# Patient Record
Sex: Female | Born: 1965 | Race: White | Smoking: Never smoker
Health system: Southern US, Community
[De-identification: ages and names within clinical notes are randomized; demographics above are authoritative.]

## PROBLEM LIST (undated history)

## (undated) DIAGNOSIS — G4700 Insomnia, unspecified: Secondary | ICD-10-CM

## (undated) DIAGNOSIS — G2581 Restless legs syndrome: Secondary | ICD-10-CM

## (undated) DIAGNOSIS — G43909 Migraine, unspecified, not intractable, without status migrainosus: Secondary | ICD-10-CM

## (undated) HISTORY — PX: OTHER SURGICAL HISTORY: SHX169

## (undated) HISTORY — PX: CHOLECYSTECTOMY: SHX55

## (undated) HISTORY — DX: Migraine, unspecified, not intractable, without status migrainosus: G43.909

## (undated) HISTORY — PX: ABDOMINAL HYSTERECTOMY: SHX81

## (undated) HISTORY — DX: Insomnia, unspecified: G47.00

## (undated) HISTORY — DX: Restless legs syndrome: G25.81

---

## 2018-09-02 DIAGNOSIS — E282 Polycystic ovarian syndrome: Secondary | ICD-10-CM | POA: Insufficient documentation

## 2018-09-02 DIAGNOSIS — F5104 Psychophysiologic insomnia: Secondary | ICD-10-CM | POA: Insufficient documentation

## 2018-09-02 DIAGNOSIS — D509 Iron deficiency anemia, unspecified: Secondary | ICD-10-CM | POA: Insufficient documentation

## 2018-09-02 DIAGNOSIS — G43109 Migraine with aura, not intractable, without status migrainosus: Secondary | ICD-10-CM | POA: Insufficient documentation

## 2018-09-02 DIAGNOSIS — R251 Tremor, unspecified: Secondary | ICD-10-CM | POA: Insufficient documentation

## 2018-09-02 DIAGNOSIS — G2581 Restless legs syndrome: Secondary | ICD-10-CM | POA: Insufficient documentation

## 2019-07-24 DIAGNOSIS — M5440 Lumbago with sciatica, unspecified side: Secondary | ICD-10-CM | POA: Insufficient documentation

## 2019-07-24 DIAGNOSIS — Z6831 Body mass index (BMI) 31.0-31.9, adult: Secondary | ICD-10-CM | POA: Insufficient documentation

## 2020-02-16 ENCOUNTER — Other Ambulatory Visit (HOSPITAL_COMMUNITY): Payer: Self-pay | Admitting: Orthopedic Surgery

## 2020-02-16 ENCOUNTER — Other Ambulatory Visit: Payer: Self-pay | Admitting: Orthopedic Surgery

## 2020-02-16 DIAGNOSIS — M5442 Lumbago with sciatica, left side: Secondary | ICD-10-CM

## 2020-02-16 DIAGNOSIS — G8929 Other chronic pain: Secondary | ICD-10-CM

## 2020-02-27 ENCOUNTER — Ambulatory Visit: Payer: PRIVATE HEALTH INSURANCE

## 2020-03-11 ENCOUNTER — Ambulatory Visit
Admission: RE | Admit: 2020-03-11 | Discharge: 2020-03-11 | Disposition: A | Discharge status: Home / Self Care | Payer: PRIVATE HEALTH INSURANCE | Source: Ambulatory Visit | Attending: Orthopedic Surgery | Admitting: Orthopedic Surgery

## 2020-03-11 ENCOUNTER — Other Ambulatory Visit: Payer: Self-pay

## 2020-03-11 DIAGNOSIS — G8929 Other chronic pain: Secondary | ICD-10-CM | POA: Diagnosis present

## 2020-03-11 DIAGNOSIS — M5442 Lumbago with sciatica, left side: Secondary | ICD-10-CM | POA: Diagnosis present

## 2020-03-11 DIAGNOSIS — M5441 Lumbago with sciatica, right side: Secondary | ICD-10-CM | POA: Insufficient documentation

## 2020-04-30 ENCOUNTER — Encounter: Payer: Self-pay | Admitting: Pain Medicine

## 2020-04-30 ENCOUNTER — Ambulatory Visit
Admission: RE | Admit: 2020-04-30 | Discharge: 2020-04-30 | Disposition: A | Discharge status: Home / Self Care | Payer: No Typology Code available for payment source | Source: Ambulatory Visit | Attending: Pain Medicine | Admitting: Pain Medicine

## 2020-04-30 ENCOUNTER — Ambulatory Visit: Payer: No Typology Code available for payment source | Admitting: Pain Medicine

## 2020-04-30 ENCOUNTER — Ambulatory Visit
Admission: RE | Admit: 2020-04-30 | Discharge: 2020-04-30 | Disposition: A | Discharge status: Home / Self Care | Payer: No Typology Code available for payment source | Attending: Pain Medicine | Admitting: Pain Medicine

## 2020-04-30 ENCOUNTER — Other Ambulatory Visit: Payer: Self-pay

## 2020-04-30 VITALS — BP 130/83 | HR 93 | Temp 97.9°F | Resp 16 | Ht 66.0 in | Wt 190.0 lb

## 2020-04-30 DIAGNOSIS — Z79891 Long term (current) use of opiate analgesic: Secondary | ICD-10-CM | POA: Insufficient documentation

## 2020-04-30 DIAGNOSIS — G8929 Other chronic pain: Secondary | ICD-10-CM

## 2020-04-30 DIAGNOSIS — M545 Low back pain, unspecified: Secondary | ICD-10-CM

## 2020-04-30 DIAGNOSIS — M899 Disorder of bone, unspecified: Secondary | ICD-10-CM | POA: Insufficient documentation

## 2020-04-30 DIAGNOSIS — M25552 Pain in left hip: Secondary | ICD-10-CM | POA: Insufficient documentation

## 2020-04-30 DIAGNOSIS — M47816 Spondylosis without myelopathy or radiculopathy, lumbar region: Secondary | ICD-10-CM | POA: Insufficient documentation

## 2020-04-30 DIAGNOSIS — M5137 Other intervertebral disc degeneration, lumbosacral region: Secondary | ICD-10-CM | POA: Insufficient documentation

## 2020-04-30 DIAGNOSIS — M47817 Spondylosis without myelopathy or radiculopathy, lumbosacral region: Secondary | ICD-10-CM | POA: Insufficient documentation

## 2020-04-30 DIAGNOSIS — G894 Chronic pain syndrome: Secondary | ICD-10-CM | POA: Insufficient documentation

## 2020-04-30 DIAGNOSIS — Z79899 Other long term (current) drug therapy: Secondary | ICD-10-CM | POA: Insufficient documentation

## 2020-04-30 DIAGNOSIS — M48061 Spinal stenosis, lumbar region without neurogenic claudication: Secondary | ICD-10-CM | POA: Insufficient documentation

## 2020-04-30 DIAGNOSIS — F1120 Opioid dependence, uncomplicated: Secondary | ICD-10-CM | POA: Insufficient documentation

## 2020-04-30 DIAGNOSIS — Z789 Other specified health status: Secondary | ICD-10-CM | POA: Insufficient documentation

## 2020-04-30 DIAGNOSIS — M5441 Lumbago with sciatica, right side: Secondary | ICD-10-CM | POA: Insufficient documentation

## 2020-04-30 DIAGNOSIS — R937 Abnormal findings on diagnostic imaging of other parts of musculoskeletal system: Secondary | ICD-10-CM | POA: Insufficient documentation

## 2020-04-30 NOTE — Progress Notes (Signed)
Patient: Sara Willis  Service Category: E/M  Provider: Gaspar Cola, MD  DOB: 08-06-1965  DOS: 04/30/2020  Referring Provider: Doyle Askew, MD  MRN: [id number]  Setting: Ambulatory outpatient  PCP: Gustavo Lah, MD  Type: New Patient  Specialty: Interventional Pain Management    Location: Office  Delivery: Face-to-face     Primary Reason(s) for Visit: Encounter for initial evaluation of one or more chronic problems (new to examiner) potentially causing chronic pain, and posing a threat to normal musculoskeletal function. (Level of risk: High) CC: Back Pain (Lower, both sides, worse on right side)  HPI  Sara Willis is a 55 y.o. year old, female patient, who comes for the first time to our practice referred by Girtha Hake I, MD for our initial evaluation of her chronic pain. 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 Today she comes in for evaluation of her Back Pain (Lower, both sides, worse on right side)  Pain Assessment: Location: Right,Left,Lower Back Radiating: right buttock Onset: More than a month ago Duration: Chronic pain Quality: Shooting Severity: 4 /10  (subjective, self-reported pain score)  Effect on ADL: difficulty performing daily activities Timing: Constant Modifying factors: Flexeril, NSAIDS BP: 130/83  HR: 93  Onset and Duration: Gradual and Date of onset: 11/21 Cause of pain: Unknown Severity: No change since onset, NAS-11 at its worse: 8/10, NAS-11 at its best: 2/10, NAS-11 now: 4/10 and NAS-11 on the average: 7/10 Timing: Night, During activity or exercise and After activity or exercise Aggravating Factors: Bending, Kneeling, Lifiting, Motion, Twisting and Walking Alleviating Factors: Medications and Resting Associated Problems: Pain that wakes patient up Quality of Pain: Agonizing, Constant, Distressing, Exhausting, Sharp, Shooting and Stabbing Previous Examinations or Tests: MRI scan and X-rays Previous Treatments: Physical Therapy  The patient comes into the clinic today for an evaluation for a possible bilateral L4 transforaminal ESI under fluoroscopic guidance and IV sedation, recommended by Dr. Girtha Hake from the [facility] department.  According to the patient the primary area of pain is that of the lower back (Bilateral) (R>L).  She denies any back surgeries but does admit having had nerve blocks in the form of an apparent facet block done around December, 2021 by Dr. Oleta Mouse ([facility]).  The patient indicated that the procedure was done with no sedation, she experienced no paresthesias or adverse reactions, it was done with steroids, and it did provide her with short-term (1 to 2 days) of 80% relief from her back pain.  At the time she was not experiencing any type of lower extremity pain.  However, the patient classified this procedure as unsuccessful since she was expecting to get long-term benefit.  Notes from Dr. Girtha Hake also made mention of this "unsuccessful" procedure.  The patient did have physical therapy for this low back pain around August 2021 and 4.  2 to 3 months.  She describes that the physical  therapy not only did not help the pain but it actually made it worse.  The patient's secondary area pain is that of the mid back (Midline).  She indicates that currently she is not having this pain but around 2000 (8th area of Pain) she had a cervical disc herniation and they referred pain towards the mid lower back around the midline.  At that time she was residing in [place] where she had a nerve block done which did help this pain and the pain went away for approximately 2 to 3 years.  She refers that this nerve block was done by Dr. Deatra Ina in [place] ([facility]) telephone number: [phone].  The patient's third area of involvement is that of the hands (Bilateral) (Right = Left).  She indicated that the primary problem was numbness.  She denies any current pain or weakness.  She also indicated that they ruled out carpal tunnel syndrome (CTS).  She describes that in both hands it affects the pinky finger and the ring finger (C8 dermatome).  Physical exam: The patient was able to toe walk and heel walk without any problems.  She denies any lower extremity pain, numbness, or weakness.  Hyperextension and rotation as well as Kemp maneuver were positive bilaterally for facet arthralgia that was ipsilateral to the side being tested.  The patient also confirmed that it exactly reproduced her usual low back pain.  Lumbar spine range of motion seems to be limited on hyperextension on rotation, probably due to guarding.  Flexion was asymptomatic and within normal limits in terms of her range of motion.  Patrick maneuver was positive on the left side for hip arthralgia.  Range of motion of the hip joints seem to be relatively within normal limits.  Sacroiliac joint testing was negative during the Patrick maneuver and distraction maneuver.  The rest of exam was within normal limits.  Pharmacotherapy: The patient is currently taking 5 mg of methadone at bedtime, but she indicates that this  is not being prescribed to her for pain but for restless leg syndrome.  Note from Dr. Girtha Hake on 03/21/2020 indicates that the patient had lumbar facet injections done at [facility], which did not provide her with any relief of the pain.  She indicates in the note that she will be sending the patient over to [facility] to have the procedure done with sedation due to the patient's anxiety.  She indicates patient has been sent for bilateral L4 transforaminal ESI in an attempt to target the L3-4 CCS (central canal stenosis).  Dr. Alba Destine indicates that she will follow-up with the patient in 2 weeks after the injections.  Imaging review from Dr. Alba Destine note: "MRI lumbar spine 03/2020 done at [facility]: L3-4 central disc protrusion with mild CCS (central canal stenosis) and bilateral NFS (neuroforaminal stenosis); mild facet arthropathy L3-4 and moderate facet arthropathy L4-5"  Based on today's evaluation, it is my impression that although the patient is MRI reveals central spinal stenosis as well as foraminal stenosis, neither one of those is currently symptomatic.  However, the MRI also describes facet joint arthropathy, which at this point seems to be clinically responsible for the patient's pain.  I do realize that the patient had a facet block apparently done by Etheleen Mayhew, MD [facility]) done on 01/03/2020, according to my review of "Care Everywhere".  According to the procedure note, this procedure was done under fluoroscopic guidance using 1 mL of  1% preservative-free lidocaine with 60 mg of Kenalog.  The procedure performed was a right-sided, intra-articular L4-5 and L5-S1 facet joint injection with 0.5 mL of contrast to confirm location, followed by 30 mg of Kenalog with 1 mL of 1% preservative-free lidocaine, for each facet joint.  Because the patient's MRI was done on 03/11/2020, they did not have the benefit of knowing that the facet degenerative changes  were located primarily at the L3-4 and L4-5 levels.  Statistically 98% of the cases involving lumbar facet syndrome, will involve the L3-4, L4-5, and L5-S1 facet joints.  Based on the information above, I have informed the patient that I would recommend doing a diagnostic bilateral lumbar facet MBB of the L3-4, L4-5, and L5-S1 facet joints, under fluoroscopic guidance and IV sedation.  I have explained the logic to the patient who understood and accepted.  Today I took the time to provide the patient with information regarding my pain practice. The patient was informed that my practice is divided into two sections: an interventional pain management section, as well as a completely separate and distinct medication management section. I explained that I have procedure days for my interventional therapies, and evaluation days for follow-ups and medication management. Because of the amount of documentation required during both, they are kept separated. This means that there is the possibility that she may be scheduled for a procedure on one day, and medication management the next. I have also informed her that because of staffing and facility limitations, I no longer take patients for medication management only. To illustrate the reasons for this, I gave the patient the example of surgeons, and how inappropriate it would be to refer a patient to his/her care, just to write for the post-surgical antibiotics on a surgery done by a different surgeon.   Because interventional pain management is my board-certified specialty, the patient was informed that joining my practice means that they are open to any and all interventional therapies. I made it clear that this does not mean that they will be forced to have any procedures done. What this means is that I believe interventional therapies to be essential part of the diagnosis and proper management of chronic pain conditions. Therefore, patients not interested in these  interventional alternatives will be better served under the care of a different practitioner.  The patient was also made aware of my Comprehensive Pain Management Safety Guidelines where by joining my practice, they limit all of their nerve blocks and joint injections to those done by our practice, for as long as we are retained to manage their care.   Historic Controlled Substance Pharmacotherapy Review  PMP and historical list of controlled substances: Methadone 5 mg tablet, 1.5 tabs p.o. daily (7.5 mg/day of methadone) (last prescribed on 04/04/2020 by Dr. Cherylann Ratel, and filled on 04/04/2020) (22.5 MME/day) Current opioid analgesics: Methadone 5 mg tablet, 1.5 tabs p.o. daily (7.5 mg/day of methadone)  MME/day: 22.5 mg/day  Historical Monitoring: The patient  reports no history of drug use. List of all UDS Test(s): No results found for: MDMA, COCAINSCRNUR, [place], [place], CANNABQUANT, THCU, [place] List of other Serum/Urine Drug Screening Test(s):  No results found for: AMPHSCRSER, BARBSCRSER, BENZOSCRSER, COCAINSCRSER, COCAINSCRNUR, PCPSCRSER, PCPQUANT, THCSCRSER, THCU, CANNABQUANT, OPIATESCRSER, OXYSCRSER, PROPOXSCRSER, ETH Historical Background Evaluation: [place] PMP: PDMP reviewed during this encounter. Online review of the past 1-monthperiod conducted.             PMP NARX Score Report:  Narcotic: 391 Sedative: 250 Stimulant:  000 [place] Department of public safety, offender search: Editor, commissioning Information) Non-contributory Risk Assessment Profile: Aberrant behavior: None observed or detected today Risk factors for fatal opioid overdose: None identified today PMP NARX Overdose Risk Score: 160 Fatal overdose hazard ratio (HR): Calculation deferred Non-fatal overdose hazard ratio (HR): Calculation deferred Risk of opioid abuse or dependence: 0.7-3.0% with doses ? 36 MME/day and 6.1-26% with doses ? 120 MME/day. Substance use disorder (SUD) risk level: See below Personal History of  Substance Abuse (SUD-Substance use disorder):  Alcohol: Negative  Illegal Drugs: Negative  Rx Drugs: Negative  ORT Risk Level calculation: Low Risk  Opioid Risk Tool - 04/30/20 1328      Family History of Substance Abuse   Alcohol Negative    Illegal Drugs Negative    Rx Drugs Negative      Personal History of Substance Abuse   Alcohol Negative    Illegal Drugs Negative    Rx Drugs Negative      Age   Age between 54-45 years  No      History of Preadolescent Sexual Abuse   History of Preadolescent Sexual Abuse Negative or Female      Psychological Disease   Psychological Disease Negative    Depression Negative      Total Score   Opioid Risk Tool Scoring 0    Opioid Risk Interpretation Low Risk          ORT Scoring interpretation table:  Score <3 = Low Risk for SUD  Score between 4-7 = Moderate Risk for SUD  Score >8 = High Risk for Opioid Abuse   PHQ-2 Depression Scale:  Total score: 0  PHQ-2 Scoring interpretation table: (Score and probability of major depressive disorder)  Score 0 = No depression  Score 1 = 15.4% Probability  Score 2 = 21.1% Probability  Score 3 = 38.4% Probability  Score 4 = 45.5% Probability  Score 5 = 56.4% Probability  Score 6 = 78.6% Probability   PHQ-9 Depression Scale:  Total score: 0  PHQ-9 Scoring interpretation table:  Score 0-4 = No depression  Score 5-9 = Mild depression  Score 10-14 = Moderate depression  Score 15-19 = Moderately severe depression  Score 20-27 = Severe depression (2.4 times higher risk of SUD and 2.89 times higher risk of overuse)   Pharmacologic Plan: As per protocol, I have not taken over any controlled substance management, pending the results of ordered tests and/or consults.            Initial impression: The patient indicated having no interest, at this time.  Meds   Current Outpatient Medications:  .  ascorbic acid (VITAMIN C) 500 MG tablet, Take by mouth., Disp: , Rfl:  .  cyclobenzaprine  (FLEXERIL) 10 MG tablet, Take 1 tablet by mouth 3 (three) times daily., Disp: , Rfl:  .  Docusate Sodium (COLACE PO), Take 200 mg by mouth daily., Disp: , Rfl:  .  ferrous sulfate 325 (65 FE) MG tablet, Take by mouth., Disp: , Rfl:  .  Magnesium 250 MG TABS, Take by mouth., Disp: , Rfl:  .  methadone (DOLOPHINE) 5 MG tablet, 7 mg, Disp: , Rfl:  .  rizatriptan (MAXALT) 10 MG tablet, , Disp: , Rfl:  .  traZODone (DESYREL) 100 MG tablet, Take by mouth., Disp: , Rfl:  .  VITAMIN D, CHOLECALCIFEROL, PO, Take 5,000 Units by mouth., Disp: , Rfl:   Imaging Review  Lumbosacral Imaging: Lumbar MR wo contrast: Results for orders placed  during the hospital encounter of 03/11/20 MR LUMBAR SPINE WO CONTRAST  Narrative CLINICAL DATA:  Chronic low back pain  EXAM: MRI LUMBAR SPINE WITHOUT CONTRAST  TECHNIQUE: Multiplanar, multisequence MR imaging of the lumbar spine was performed. No intravenous contrast was administered.  COMPARISON:  None.  FINDINGS: Segmentation:  Standard.  Alignment:  Physiologic.  Vertebrae:  No fracture, evidence of discitis, or bone lesion.  Conus medullaris and cauda equina: Conus extends to the L1 level. Conus and cauda equina appear normal.  Paraspinal and other soft tissues: Negative.  Disc levels:  T12-L1: No spinal canal or neural foraminal stenosis.  L1-2: No spinal canal or neural foraminal stenosis.  L2-3: No spinal canal or neural foraminal stenosis.  L3-4: Mild loss of disc height, central disc protrusion and mild facet degenerative changes resulting in mild spinal canal stenosis and mild bilateral neural foraminal narrowing.  L4-5: Shallow disc bulge and moderate facet degenerative change without significant spinal canal or neural foraminal stenosis.  L5-S1: No spinal canal or neural foraminal stenosis.  IMPRESSION: Mild degenerative changes of the lumbar spine, worst at L3-4 where there is mild spinal canal stenosis and mild bilateral  neural foraminal narrowing.   Electronically Signed By: Pedro Earls M.D. On: 03/11/2020 13:09  Complexity Note: Imaging results reviewed. Results shared with Sara Willis, using Layman's terms.                        ROS  Cardiovascular: No reported cardiovascular signs or symptoms such as High blood pressure, coronary artery disease, abnormal heart rate or rhythm, heart attack, blood thinner therapy or heart weakness and/or failure Pulmonary or Respiratory: No reported pulmonary signs or symptoms such as wheezing and difficulty taking a deep full breath (Asthma), difficulty blowing air out (Emphysema), coughing up mucus (Bronchitis), persistent dry cough, or temporary stoppage of breathing during sleep Neurological: No reported neurological signs or symptoms such as seizures, abnormal skin sensations, urinary and/or fecal incontinence, being born with an abnormal open spine and/or a tethered spinal cord Psychological-Psychiatric: No reported psychological or psychiatric signs or symptoms such as difficulty sleeping, anxiety, depression, delusions or hallucinations (schizophrenial), mood swings (bipolar disorders) or suicidal ideations or attempts Gastrointestinal: No reported gastrointestinal signs or symptoms such as vomiting or evacuating blood, reflux, heartburn, alternating episodes of diarrhea and constipation, inflamed or scarred liver, or pancreas or irrregular and/or infrequent bowel movements Genitourinary: No reported renal or genitourinary signs or symptoms such as difficulty voiding or producing urine, peeing blood, non-functioning kidney, kidney stones, difficulty emptying the bladder, difficulty controlling the flow of urine, or chronic kidney disease Hematological: Weakness due to low blood hemoglobin or red blood cell count (Anemia) Endocrine: No reported endocrine signs or symptoms such as high or low blood sugar, rapid heart rate due to high thyroid levels,  obesity or weight gain due to slow thyroid or thyroid disease Rheumatologic: No reported rheumatological signs and symptoms such as fatigue, joint pain, tenderness, swelling, redness, heat, stiffness, decreased range of motion, with or without associated rash Musculoskeletal: Negative for myasthenia gravis, muscular dystrophy, multiple sclerosis or malignant hyperthermia Work History: Retired  Allergies  Sara Willis is allergic to lidocaine.  Laboratory Chemistry Profile   Renal No results found for: BUN, CREATININE, LABCREA, BCR, GFR, GFRAA, GFRNONAA, SPECGRAV, PHUR, PROTEINUR   Electrolytes No results found for: NA, K, CL, CALCIUM, MG, PHOS   Hepatic No results found for: AST, ALT, ALBUMIN, ALKPHOS, AMYLASE, LIPASE, AMMONIA   ID No  results found for: LYMEIGGIGMAB, HIV, [place], STAPHAUREUS, MRSAPCR, HCVAB, PREGTESTUR, RMSFIGG, QFVRPH1IGG, QFVRPH2IGG, LYMEIGGIGMAB   Bone No results found for: [place], [id number], [id number], [id number], 25OHVITD1, 25OHVITD2, 25OHVITD3, TESTOFREE, TESTOSTERONE   Endocrine No results found for: GLUCOSE, GLUCOSEU, HGBA1C, TSH, FREET4, TESTOFREE, TESTOSTERONE, SHBG, ESTRADIOL, ESTRADIOLPCT, ESTRADIOLFRE, LABPREG, ACTH, CRTSLPL, UCORFRPERLTR, UCORFRPERDAY, CORTISOLBASE, LABPREG   Neuropathy No results found for: VITAMINB12, FOLATE, HGBA1C, HIV   CNS No results found for: COLORCSF, APPEARCSF, RBCCOUNTCSF, WBCCSF, POLYSCSF, LYMPHSCSF, EOSCSF, PROTEINCSF, GLUCCSF, JCVIRUS, CSFOLI, IGGCSF, LABACHR, ACETBL, LABACHR, ACETBL   Inflammation (CRP: Acute  ESR: Chronic) No results found for: CRP, ESRSEDRATE, LATICACIDVEN   Rheumatology No results found for: RF, ANA, LABURIC, URICUR, LYMEIGGIGMAB, LYMEABIGMQN, HLAB27   Coagulation No results found for: INR, LABPROT, APTT, PLT, DDIMER, LABHEMA, VITAMINK1, AT3   Cardiovascular No results found for: BNP, CKTOTAL, CKMB, TROPONINI, HGB, HCT, LABVMA, EPIRU, EPINEPH24HUR, NOREPRU, NOREPI24HUR, DOPARU, DOPAM24HRUR    Screening No results found for: [place], COVIDSOURCE, STAPHAUREUS, MRSAPCR, HCVAB, HIV, PREGTESTUR   Cancer No results found for: CEA, CA125, LABCA2   Allergens No results found for: ALMOND, APPLE, ASPARAGUS, AVOCADO, BANANA, BARLEY, BASIL, B[place]LE[place], GREENBEAN, LIMABEAN, WHITEBEAN, BEEFIGE, REDBEET, BLUEBERRY, BROCCOLI, CABBAGE, MELON, CARROT, CASEIN, CASHEWNUT, CAULIFLOWER, CELERY     Note: No results found under the [employer] electronic medical record  [facility]  Drug: Sara Willis  reports no history of drug use. Alcohol:  reports current alcohol use. Tobacco:  reports that she has never smoked. She has never used smokeless tobacco. Medical:  has a past medical history of Insomnia, Migraine, and Restless leg syndrome. Family: family history is not on file.   The histories are not reviewed yet. Please review them in the "History" navigator section and refresh this [place]. Active Ambulatory Problems    Diagnosis Date Noted  . BMI 31.0-31.9,adult 07/24/2019  . Chronic insomnia 09/02/2018  . Iron deficiency anemia 09/02/2018  . Migraine with aura and without status migrainosus, not intractable 09/02/2018  . PCOS (polycystic ovarian syndrome) 09/02/2018  . Restless leg syndrome 09/02/2018  . Tremors of nervous system 09/02/2018  . Chronic pain syndrome 04/30/2020  . Pharmacologic therapy 04/30/2020  . Disorder of skeletal system 04/30/2020  . Problems influencing health status 04/30/2020  . 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 Abnormal MRI, lumbar spine (03/11/2020) 04/30/2020  . 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 Lumbar facet arthropathy (Multilevel) (Bilateral) 04/30/2020  . Chronic low back pain (1ry area of Pain) (Bilateral) (R>L) w/o sciatica 04/30/2020  . Lumbar facet syndrome (Bilateral) 04/30/2020  . DDD (degenerative disc disease),  lumbosacral 04/30/2020  . Spondylosis without myelopathy or radiculopathy, lumbosacral region 04/30/2020  . Chronic hip pain (Left) 04/30/2020   Resolved Ambulatory Problems    Diagnosis Date Noted  . Acute right-sided low back pain with sciatica 07/24/2019  . Lumbago w/ sciatica (Right) 04/30/2020   Past Medical History:  Diagnosis Date  . Insomnia   . Migraine    Constitutional Exam  General appearance: Well nourished, well developed, and well hydrated. In no apparent acute distress Vitals:   04/30/20 1311  BP: 130/83  Pulse: 93  Resp: 16  Temp: 97.9 F (36.6 C)  TempSrc: Temporal  SpO2: 100%  Weight: 190 lb (86.2 kg)  Height: 5' 6"  (1.676 m)   BMI Assessment: Estimated body mass index is 30.67 kg/m as calculated from the following:   Height as of this encounter: 5' 6"  (1.676 m).   Weight  as of this encounter: 190 lb (86.2 kg).  BMI interpretation table: BMI level Category Range association with higher incidence of chronic pain  <18 kg/m2 Underweight   18.5-24.9 kg/m2 Ideal body weight   25-29.9 kg/m2 Overweight Increased incidence by 20%  30-34.9 kg/m2 Obese (Class I) Increased incidence by 68%  35-39.9 kg/m2 Severe obesity (Class II) Increased incidence by 136%  >40 kg/m2 Extreme obesity (Class III) Increased incidence by 254%   Patient's current BMI Ideal Body weight  Body mass index is 30.67 kg/m. Ideal body weight: 59.3 kg (130 lb 11.7 oz) Adjusted ideal body weight: 70.1 kg (154 lb 7 oz)   BMI Readings from Last 4 Encounters:  04/30/20 30.67 kg/m   Wt Readings from Last 4 Encounters:  04/30/20 190 lb (86.2 kg)    Psych/Mental status: Alert, oriented x 3 (person, place, & time)       Eyes: PERLA Respiratory: No evidence of acute respiratory distress  Assessment  Primary Diagnosis & Pertinent Problem List: The primary encounter diagnosis was Chronic low back pain (1ry area of Pain) (Bilateral) (R>L) w/o sciatica. 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  Visit Diagnosis (New problems to examiner): 1. Chronic low back pain (1ry area of Pain) (Bilateral) (R>L) w/o sciatica   2. Lumbar facet syndrome (Bilateral)   3. Lumbar facet arthropathy (Multilevel) (Bilateral)   4. Spondylosis without myelopathy or radiculopathy, lumbosacral region   5. DDD (degenerative disc disease), lumbosacral   6. 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 Abnormal MRI, lumbar spine (03/11/2020)   9. Chronic hip pain (Left)   10. Chronic pain syndrome    Plan of Care (Initial workup plan)  Note: Sara Willis was reminded that as per protocol, today's visit has been an evaluation only. We have not taken over the patient's controlled substance management.  Problem-specific plan: No problem-specific Assessment & Plan notes found for this encounter.  Lab Orders  No laboratory test(s) ordered today    Imaging Orders     DG HIP UNILAT W OR W/O PELVIS 2-3 VIEWS LEFT Referral Orders  No referral(s) requested today    Procedure Orders     LUMBAR FACET(MEDIAL BRANCH NERVE BLOCK) MBNB Pharmacotherapy (current): Medications ordered:  No orders of the defined types were placed in this encounter.  Medications administered during this visit: Sara Willis had no medications administered during this visit.   Pharmacological management options:  Opioid Analgesics: The patient was informed that there is no guarantee that she would be a candidate for opioid analgesics. The decision  will be made following CDC guidelines. This decision will be based on the results of diagnostic studies, as well as Sara Willis risk profile.   Membrane stabilizer: To be determined at a later time  Muscle relaxant: To be determined at a later time  NSAID: To be determined at a later time  Other analgesic(s): To be determined at a later time   Interventional management options: Sara Willis was informed that there is no guarantee that she would be a candidate for interventional therapies. The decision will be based on the results of diagnostic studies, as well as Sara Willis  risk profile.  Procedure(s) under consideration:  Diagnostic bilateral lumbar facet medial branch block #1    Provider-requested follow-up: Return for Procedure (w/ sedation): (B) L-FCT BLK #1.  No future appointments.  Note by: Gaspar Cola, MD Date: 04/30/2020; Time: 9:08 AM

## 2020-04-30 NOTE — Patient Instructions (Addendum)
____________________________________________________________________________________________  Preparing for Procedure with Sedation  Procedure appointments are limited to planned procedures: . No Prescription Refills. . No disability issues will be discussed. . No medication changes will be discussed.  Instructions: . Oral Intake: Do not eat or drink anything for at least 8 hours prior to your procedure. (Exception: Blood Pressure Medication. See below.) . Transportation: Unless otherwise stated by your physician, you may drive yourself after the procedure. . Blood Pressure Medicine: Do not forget to take your blood pressure medicine with a sip of water the morning of the procedure. If your Diastolic (lower reading)is above 100 mmHg, elective cases will be cancelled/rescheduled. . Blood thinners: These will need to be stopped for procedures. Notify our staff if you are taking any blood thinners. Depending on which one you take, there will be specific instructions on how and when to stop it. . Diabetics on insulin: Notify the staff so that you can be scheduled 1st case in the morning. If your diabetes requires high dose insulin, take only  of your normal insulin dose the morning of the procedure and notify the staff that you have done so. . Preventing infections: Shower with an antibacterial soap the morning of your procedure. . Build-up your immune system: Take 1000 mg of Vitamin C with every meal (3 times a day) the day prior to your procedure. . Antibiotics: Inform the staff if you have a condition or reason that requires you to take antibiotics before dental procedures. . Pregnancy: If you are pregnant, call and cancel the procedure. . Sickness: If you have a cold, fever, or any active infections, call and cancel the procedure. . Arrival: You must be in the facility at least 30 minutes prior to your scheduled procedure. . Children: Do not bring children with you. . Dress appropriately:  Bring dark clothing that you would not mind if they get stained. . Valuables: Do not bring any jewelry or valuables.  Reasons to call and reschedule or cancel your procedure: (Following these recommendations will minimize the risk of a serious complication.) . Surgeries: Avoid having procedures within 2 weeks of any surgery. (Avoid for 2 weeks before or after any surgery). . Flu Shots: Avoid having procedures within 2 weeks of a flu shots or . (Avoid for 2 weeks before or after immunizations). . Barium: Avoid having a procedure within 7-10 days after having had a radiological study involving the use of radiological contrast. (Myelograms, Barium swallow or enema study). . Heart attacks: Avoid any elective procedures or surgeries for the initial 6 months after a "Myocardial Infarction" (Heart Attack). . Blood thinners: It is imperative that you stop these medications before procedures. Let us know if you if you take any blood thinner.  . Infection: Avoid procedures during or within two weeks of an infection (including chest colds or gastrointestinal problems). Symptoms associated with infections include: Localized redness, fever, chills, night sweats or profuse sweating, burning sensation when voiding, cough, congestion, stuffiness, runny nose, sore throat, diarrhea, nausea, vomiting, cold or Flu symptoms, recent or current infections. It is specially important if the infection is over the area that we intend to treat. . Heart and lung problems: Symptoms that may suggest an active cardiopulmonary problem include: cough, chest pain, breathing difficulties or shortness of breath, dizziness, ankle swelling, uncontrolled high or unusually low blood pressure, and/or palpitations. If you are experiencing any of these symptoms, cancel your procedure and contact your primary care physician for an evaluation.  Remember:  Regular Business hours are:    Monday to Thursday 8:00 AM to 4:00 PM  Provider's  Schedule: Barnett Elzey, MD:  Procedure days: Tuesday and Thursday 7:30 AM to 4:00 PM  Bilal Lateef, MD:  Procedure days: Monday and Wednesday 7:30 AM to 4:00 PM ____________________________________________________________________________________________   ____________________________________________________________________________________________  General Risks and Possible Complications  Patient Responsibilities: It is important that you read this as it is part of your informed consent. It is our duty to inform you of the risks and possible complications associated with treatments offered to you. It is your responsibility as a patient to read this and to ask questions about anything that is not clear or that you believe was not covered in this document.  Patient's Rights: You have the right to refuse treatment. You also have the right to change your mind, even after initially having agreed to have the treatment done. However, under this last option, if you wait until the last second to change your mind, you may be charged for the materials used up to that point.  Introduction: Medicine is not an exact science. Everything in Medicine, including the lack of treatment(s), carries the potential for danger, harm, or loss (which is by definition: Risk). In Medicine, a complication is a secondary problem, condition, or disease that can aggravate an already existing one. All treatments carry the risk of possible complications. The fact that a side effects or complications occurs, does not imply that the treatment was conducted incorrectly. It must be clearly understood that these can happen even when everything is done following the highest safety standards.  No treatment: You can choose not to proceed with the proposed treatment alternative. The "PRO(s)" would include: avoiding the risk of complications associated with the therapy. The "CON(s)" would include: not getting any of the treatment  benefits. These benefits fall under one of three categories: diagnostic; therapeutic; and/or palliative. Diagnostic benefits include: getting information which can ultimately lead to improvement of the disease or symptom(s). Therapeutic benefits are those associated with the successful treatment of the disease. Finally, palliative benefits are those related to the decrease of the primary symptoms, without necessarily curing the condition (example: decreasing the pain from a flare-up of a chronic condition, such as incurable terminal cancer).  General Risks and Complications: These are associated to most interventional treatments. They can occur alone, or in combination. They fall under one of the following six (6) categories: no benefit or worsening of symptoms; bleeding; infection; nerve damage; allergic reactions; and/or death. 1. No benefits or worsening of symptoms: In Medicine there are no guarantees, only probabilities. No healthcare provider can ever guarantee that a medical treatment will work, they can only state the probability that it may. Furthermore, there is always the possibility that the condition may worsen, either directly, or indirectly, as a consequence of the treatment. 2. Bleeding: This is more common if the patient is taking a blood thinner, either prescription or over the counter (example: Goody Powders, Fish oil, Aspirin, Garlic, etc.), or if suffering a condition associated with impaired coagulation (example: Hemophilia, cirrhosis of the liver, low platelet counts, etc.). However, even if you do not have one on these, it can still happen. If you have any of these conditions, or take one of these drugs, make sure to notify your treating physician. 3. Infection: This is more common in patients with a compromised immune system, either due to disease (example: diabetes, cancer, human immunodeficiency virus [HIV], etc.), or due to medications or treatments (example: therapies used to treat  cancer and   rheumatological diseases). However, even if you do not have one on these, it can still happen. If you have any of these conditions, or take one of these drugs, make sure to notify your treating physician. 4. Nerve Damage: This is more common when the treatment is an invasive one, but it can also happen with the use of medications, such as those used in the treatment of cancer. The damage can occur to small secondary nerves, or to large primary ones, such as those in the spinal cord and brain. This damage may be temporary or permanent and it may lead to impairments that can range from temporary numbness to permanent paralysis and/or brain death. 5. Allergic Reactions: Any time a substance or material comes in contact with our body, there is the possibility of an allergic reaction. These can range from a mild skin rash (contact dermatitis) to a severe systemic reaction (anaphylactic reaction), which can result in death. 6. Death: In general, any medical intervention can result in death, most of the time due to an unforeseen complication. ____________________________________________________________________________________________   Facet Joint Block The facet joints connect the bones of the spine (vertebrae). They make it possible for you to bend, twist, and make other movements with your spine. They also keep you from bending too far, twisting too far, and making other extreme movements. A facet joint block is a procedure in which a numbing medicine (anesthetic) is injected into a facet joint. In many cases, an anti-inflammatory medicine (steroid) is also injected. A facet joint block may be done:  To diagnose neck or back pain. If the pain gets better after a facet joint block, it means the pain is probably coming from the facet joint. If the pain does not get better, it means the pain is probably not coming from the facet joint.  To relieve neck or back pain that is caused by an inflamed  facet joint. A facet joint block is only done to relieve pain if the pain does not improve with other methods, such as medicine, exercise programs, and physical therapy. Tell a health care provider about:  Any allergies you have.  All medicines you are taking, including vitamins, herbs, eye drops, creams, and over-the-counter medicines.  Any problems you or family members have had with anesthetic medicines.  Any blood disorders you have.  Any surgeries you have had.  Any medical conditions you have or have had.  Whether you are pregnant or may be pregnant. What are the risks? Generally, this is a safe procedure. However, problems may occur, including:  Bleeding.  Injury to a nerve near the injection site.  Pain at the injection site.  Weakness or numbness in areas controlled by nerves near the injection site.  Infection.  Temporary fluid retention.  Allergic reactions to medicines or dyes.  Injury to other structures or organs near the injection site. What happens before the procedure? Medicines Ask your health care provider about:  Changing or stopping your regular medicines. This is especially important if you are taking diabetes medicines or blood thinners.  Taking medicines such as aspirin and ibuprofen. These medicines can thin your blood. Do not take these medicines unless your health care provider tells you to take them.  Taking over-the-counter medicines, vitamins, herbs, and supplements. Eating and drinking Follow instructions from your health care provider about eating and drinking, which may include:  8 hours before the procedure - stop eating heavy meals or foods, such as meat, fried foods, or fatty foods.    6 hours before the procedure - stop eating light meals or foods, such as toast or cereal.  6 hours before the procedure - stop drinking milk or drinks that contain milk.  2 hours before the procedure - stop drinking clear liquids. Staying  hydrated Follow instructions from your health care provider about hydration, which may include:  Up to 2 hours before the procedure - you may continue to drink clear liquids, such as water, clear fruit juice, black coffee, and plain tea. General instructions  Do not use any products that contain nicotine or tobacco for at least 4-6 weeks before the procedure. These products include cigarettes, e-cigarettes, and chewing tobacco. If you need help quitting, ask your health care provider.  Plan to have someone take you home from the hospital or clinic.  Ask your health care provider: ? How your surgery site will be marked. ? What steps will be taken to help prevent infection. These may include:  Removing hair at the surgery site.  Washing skin with a germ-killing soap.  Receiving antibiotic medicine. What happens during the procedure?  You will put on a hospital gown.  You will lie on your stomach on an X-ray table. You may be asked to lie in a different position if an injection will be made in your neck.  Machines will be used to monitor your oxygen levels, heart rate, and blood pressure.  Your skin will be cleaned.  If an injection will be made in your neck, an IV will be inserted into one of your veins. Fluids and medicine will flow directly into your body through the IV.  A numbing medicine (local anesthetic) will be applied to your skin. Your skin may sting or burn for a moment.  A video X-ray machine (fluoroscopy) will be used to find the joint. In some cases, a CT scan may be used.  A contrast dye may be injected into the facet joint area to help find the joint.  When the joint is located, an anesthetic will be injected into the joint through the needle.  Your health care provider will ask you whether you feel pain relief. ? If you feel relief, a steroid may be injected to provide pain relief for a longer period of time. ? If you do not feel relief or feel only partial  relief, additional injections of an anesthetic may be made in other facet joints.  The needle will be removed.  Your skin will be cleaned.  A bandage (dressing) will be applied over each injection site. The procedure may vary among health care providers and hospitals.   What happens after the procedure?  Your blood pressure, heart rate, breathing rate, and blood oxygen level will be monitored until you leave the hospital or clinic.  You will lie down and rest for a period of time. Summary  A facet joint block is a procedure in which a numbing medicine (anesthetic) is injected into a facet joint. An anti-inflammatory medicine (stereoid) may also be injected.  Follow instructions from your health care provider about medicines and eating and drinking before the procedure.  Do not use any products that contain nicotine or tobacco for at least 4-6 weeks before the procedure.  You will lie on your stomach for the procedure, but you may be asked to lie in a different position if an injection will be made in your neck.  When the joint is located, an anesthetic will be injected into the joint through the needle. This  information is not intended to replace advice given to you by your health care provider. Make sure you discuss any questions you have with your health care provider. Document Revised: 04/14/2018 Document Reviewed: 11/26/2017 [employer]vier Patient Education  2021 [employer].

## 2020-05-15 ENCOUNTER — Telehealth: Payer: Self-pay | Admitting: Pain Medicine

## 2020-05-15 NOTE — Telephone Encounter (Signed)
Patient called and given result.

## 2020-05-15 NOTE — Telephone Encounter (Signed)
Patient would like results of xray. Also she will no longer be able to come to this clinic. Her insurance is out of network and she has to find someone that it will cover.

## 2022-12-05 IMAGING — CR DG HIP (WITH OR WITHOUT PELVIS) 2-3V*L*
3 series · 3 of 3 positions shown · non-contrast
Comparison: None.

CLINICAL DATA: Chronic left hip pain

EXAM:
DG HIP (WITH OR WITHOUT PELVIS) 2-3V LEFT

[pelvis ap]
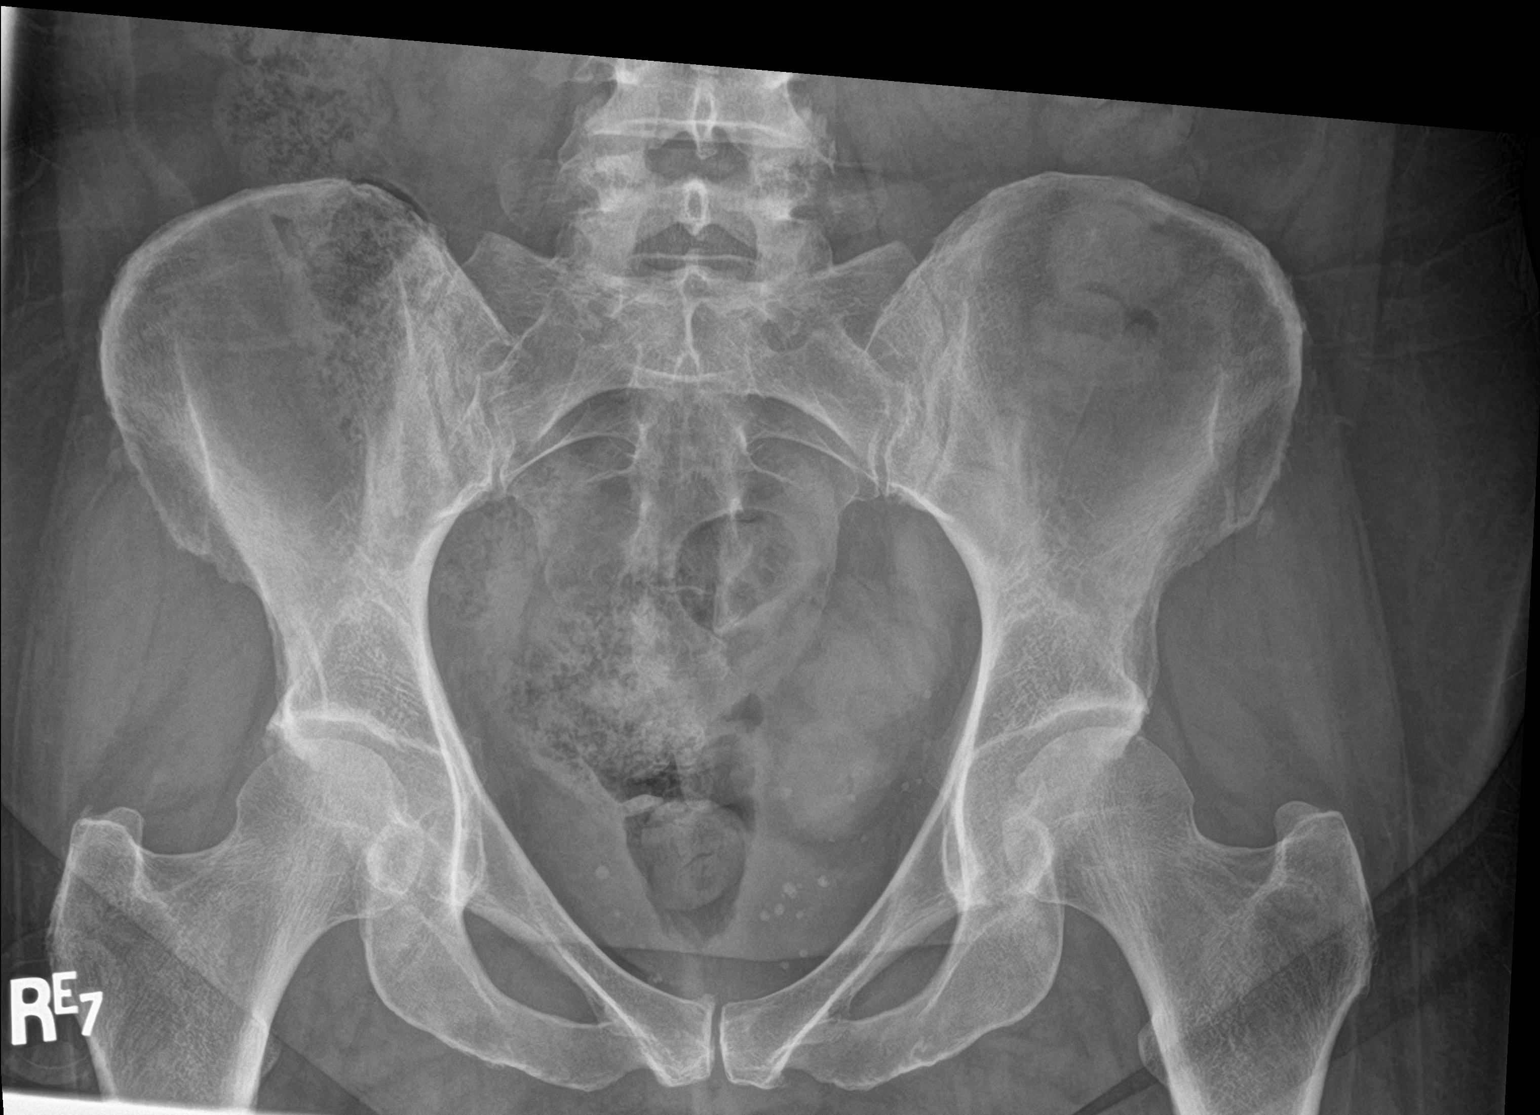

[hip ap]
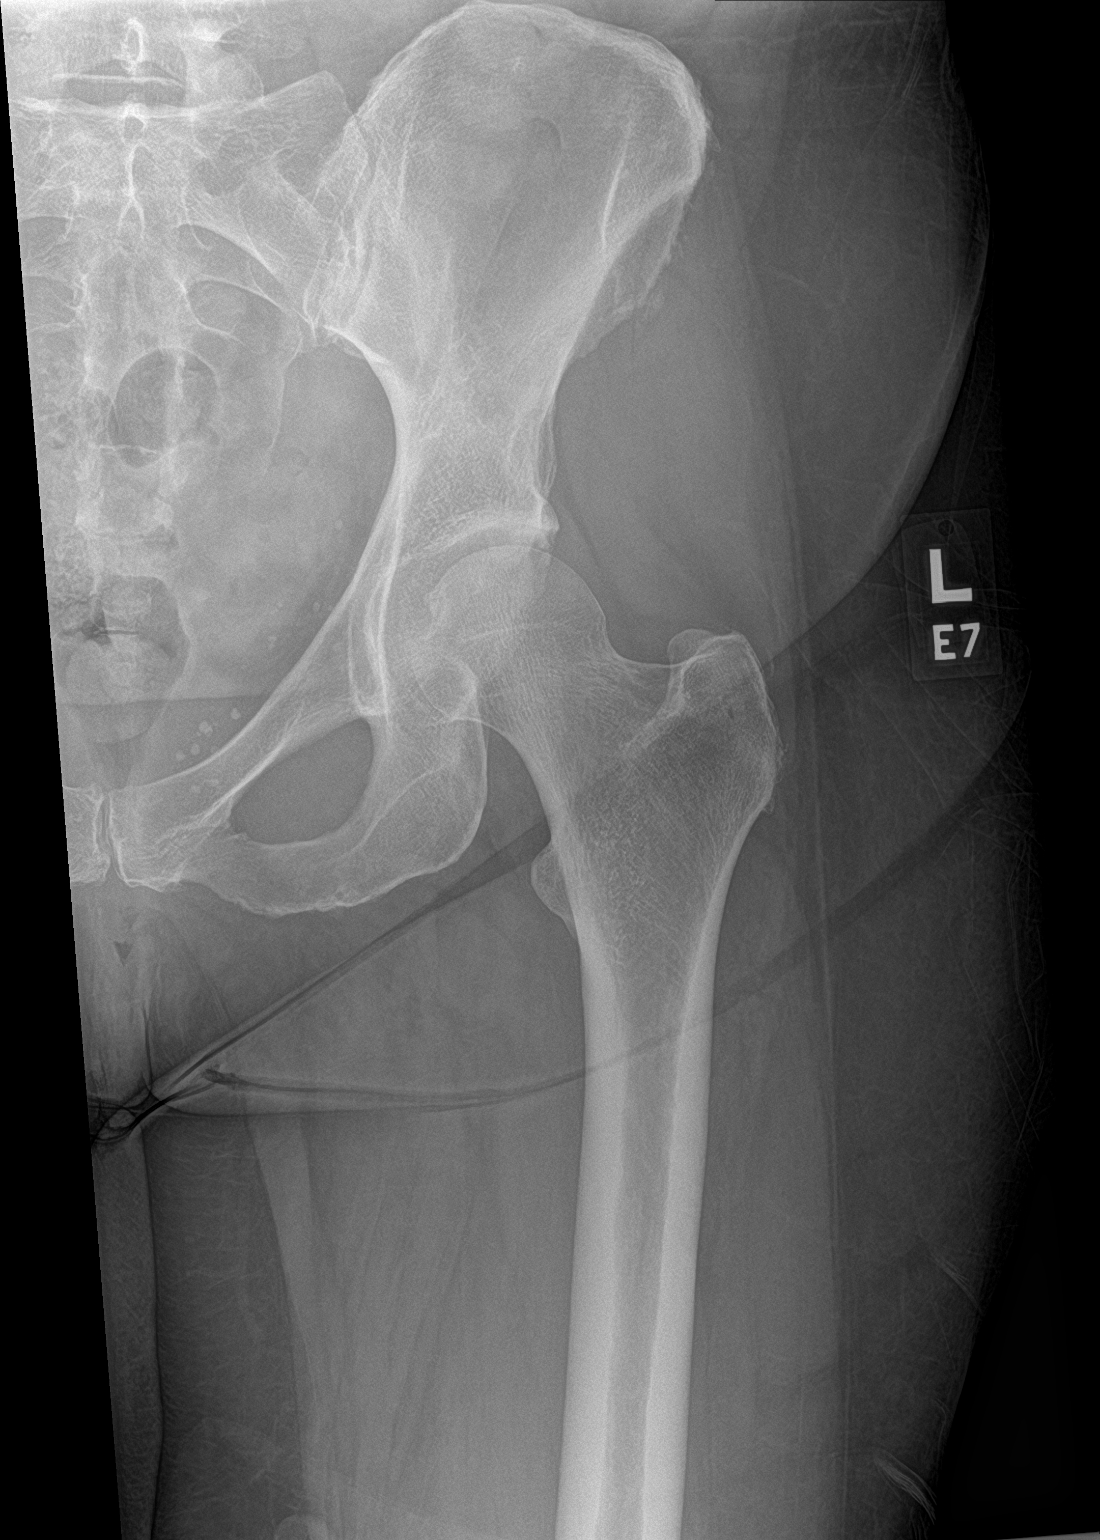

[hip lat]
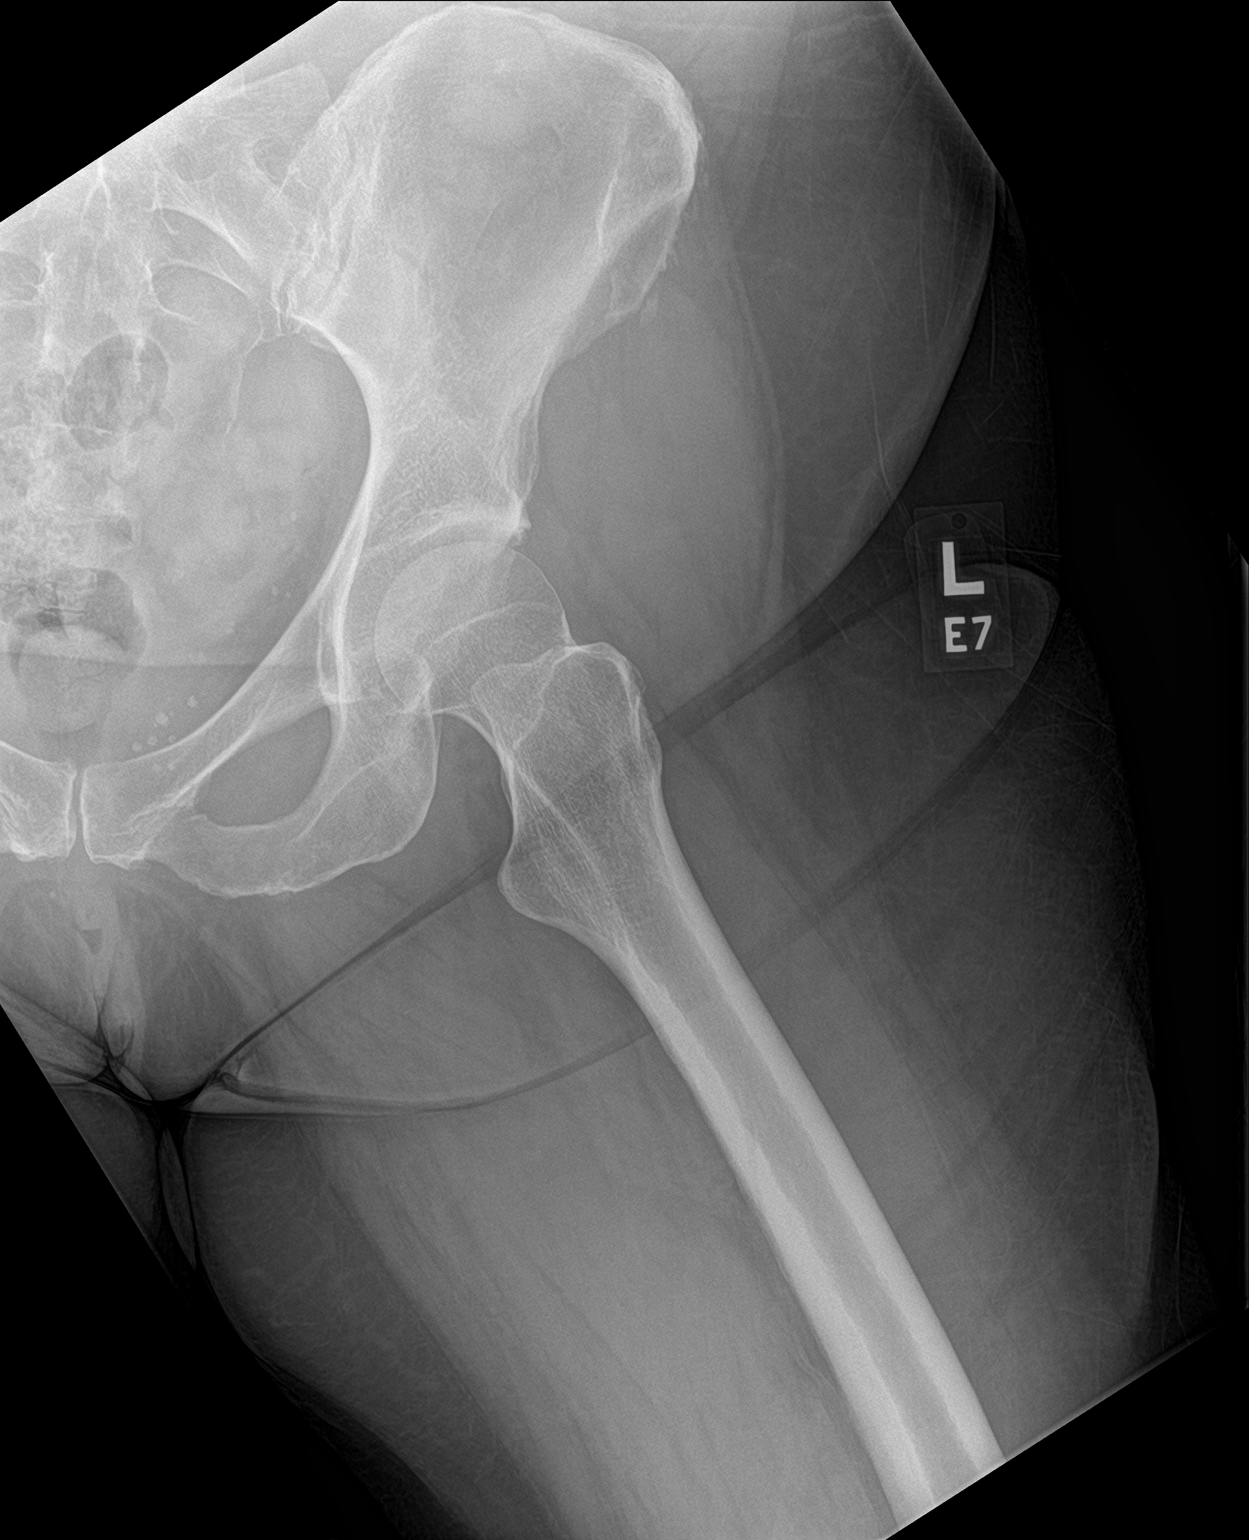

[3 of 3 positions shown; findings below may reference images not displayed]

FINDINGS: There is no evidence of hip fracture or dislocation. Hip joint space
appears well preserved. There is no evidence of arthropathy or other
focal bone abnormality.
IMPRESSION: Negative.
